# Patient Record
Sex: Female | Born: 1965 | Race: White | Hispanic: No | Marital: Married | Smoking: Former smoker
Health system: Southern US, Community
[De-identification: ages and names within clinical notes are randomized; demographics above are authoritative.]

## PROBLEM LIST (undated history)

## (undated) DIAGNOSIS — I10 Essential (primary) hypertension: Secondary | ICD-10-CM

## (undated) HISTORY — DX: Essential (primary) hypertension: I10

---

## 1997-10-07 ENCOUNTER — Other Ambulatory Visit: Admission: RE | Admit: 1997-10-07 | Discharge: 1997-10-07 | Payer: Self-pay | Admitting: *Deleted

## 1999-01-04 ENCOUNTER — Other Ambulatory Visit: Admission: RE | Admit: 1999-01-04 | Discharge: 1999-01-04 | Payer: Self-pay | Admitting: *Deleted

## 2000-03-05 ENCOUNTER — Other Ambulatory Visit: Admission: RE | Admit: 2000-03-05 | Discharge: 2000-03-05 | Payer: Self-pay | Admitting: Obstetrics & Gynecology

## 2000-09-28 ENCOUNTER — Inpatient Hospital Stay (HOSPITAL_COMMUNITY): Admission: AD | Admit: 2000-09-28 | Discharge: 2000-09-30 | Payer: Self-pay | Admitting: Obstetrics & Gynecology

## 2000-11-01 ENCOUNTER — Other Ambulatory Visit: Admission: RE | Admit: 2000-11-01 | Discharge: 2000-11-01 | Payer: Self-pay | Admitting: Obstetrics & Gynecology

## 2002-01-17 ENCOUNTER — Other Ambulatory Visit: Admission: RE | Admit: 2002-01-17 | Discharge: 2002-01-17 | Payer: Self-pay | Admitting: Obstetrics & Gynecology

## 2003-01-29 ENCOUNTER — Other Ambulatory Visit: Admission: RE | Admit: 2003-01-29 | Discharge: 2003-01-29 | Payer: Self-pay | Admitting: Obstetrics and Gynecology

## 2003-08-13 ENCOUNTER — Inpatient Hospital Stay (HOSPITAL_COMMUNITY): Admission: AD | Admit: 2003-08-13 | Discharge: 2003-08-13 | Payer: Self-pay | Admitting: Obstetrics & Gynecology

## 2003-08-14 ENCOUNTER — Inpatient Hospital Stay (HOSPITAL_COMMUNITY): Admission: AD | Admit: 2003-08-14 | Discharge: 2003-08-17 | Payer: Self-pay | Admitting: Obstetrics and Gynecology

## 2003-08-21 ENCOUNTER — Inpatient Hospital Stay (HOSPITAL_COMMUNITY): Admission: AD | Admit: 2003-08-21 | Discharge: 2003-08-21 | Payer: Self-pay | Admitting: Obstetrics and Gynecology

## 2003-09-30 ENCOUNTER — Other Ambulatory Visit: Admission: RE | Admit: 2003-09-30 | Discharge: 2003-09-30 | Payer: Self-pay | Admitting: Obstetrics & Gynecology

## 2004-06-25 ENCOUNTER — Emergency Department: Payer: Self-pay | Admitting: Emergency Medicine

## 2006-03-15 ENCOUNTER — Emergency Department: Payer: Self-pay | Admitting: General Practice

## 2007-01-08 ENCOUNTER — Ambulatory Visit: Payer: Self-pay | Admitting: Internal Medicine

## 2007-11-11 ENCOUNTER — Other Ambulatory Visit: Payer: Self-pay

## 2007-11-12 ENCOUNTER — Inpatient Hospital Stay: Payer: Self-pay | Admitting: Internal Medicine

## 2007-11-13 ENCOUNTER — Inpatient Hospital Stay: Payer: Self-pay | Admitting: Unknown Physician Specialty

## 2007-11-19 ENCOUNTER — Ambulatory Visit: Payer: Self-pay | Admitting: Unknown Physician Specialty

## 2007-12-07 ENCOUNTER — Ambulatory Visit: Payer: Self-pay | Admitting: Unknown Physician Specialty

## 2008-01-07 ENCOUNTER — Ambulatory Visit: Payer: Self-pay | Admitting: Unknown Physician Specialty

## 2008-09-07 ENCOUNTER — Ambulatory Visit: Payer: Self-pay | Admitting: Internal Medicine

## 2010-05-02 IMAGING — CR DG CHEST 1V PORT
1 series · 1 of 1 positions shown · non-contrast
Comparison: none

REASON FOR EXAM: post intubation
COMMENTS:

[view not recorded]
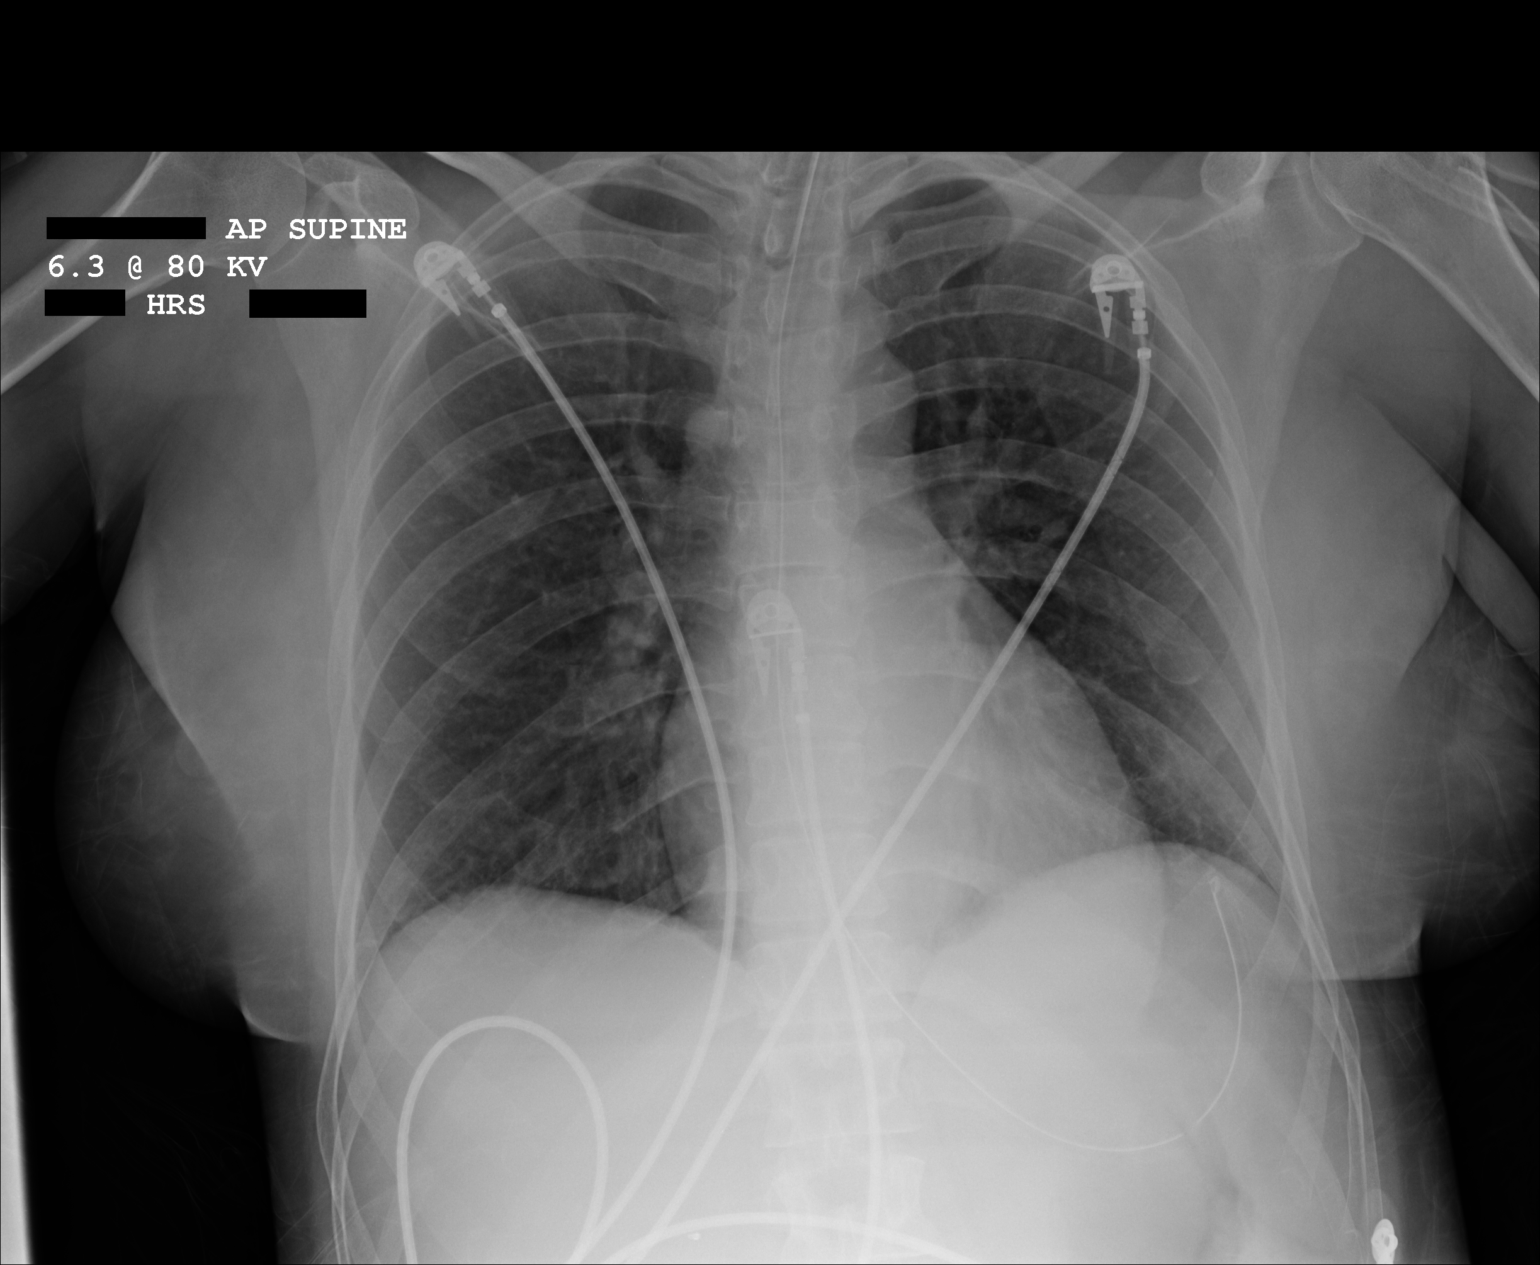

[1 of 1 positions shown; findings below may reference images not displayed]

PROCEDURE:     DXR - DXR PORTABLE CHEST SINGLE VIEW  - November 11, 2007 [DATE]

RESULT:     The lung fields are clear. No pneumothorax or pleural effusion
is seen. Heart size is normal. A nasogastric tube is present with the distal
portion in the stomach. An endotracheal tube is present with the tip
approximately 7 cm above the carina which is approximately at the T1-T2
thoracic interspace level. Monitoring electrodes are present.
IMPRESSION: 1.Please see above.

## 2011-04-17 ENCOUNTER — Ambulatory Visit: Payer: Self-pay | Admitting: Internal Medicine

## 2011-04-19 ENCOUNTER — Ambulatory Visit: Payer: Self-pay | Admitting: Internal Medicine

## 2012-04-17 ENCOUNTER — Ambulatory Visit: Payer: Self-pay | Admitting: Internal Medicine

## 2012-04-23 ENCOUNTER — Ambulatory Visit: Payer: Self-pay | Admitting: Internal Medicine

## 2013-05-20 IMAGING — MG MAM DGTL ADD VW LT  SCR
2 series · 5 of 5 positions shown · non-contrast
Comparison: none

REASON FOR EXAM: AV LT DENSITY
COMMENTS:

PROCEDURE:     MAM - MAM DGTL ADD VW LT  SCR  - April 23, 2012  [DATE]
RESULT:     Additional views left breast dated 04/23/2012.

[L ML · left · 3 of 3 slices shown]
[im 1/3]
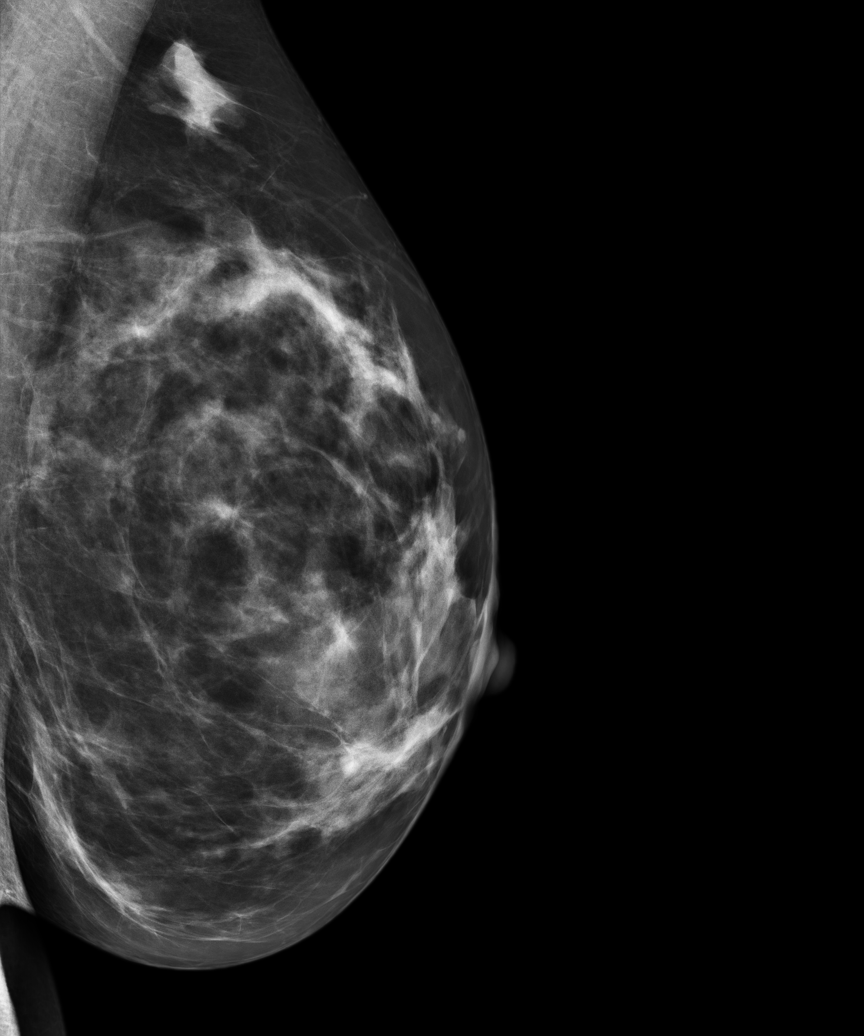
[im 2/3]
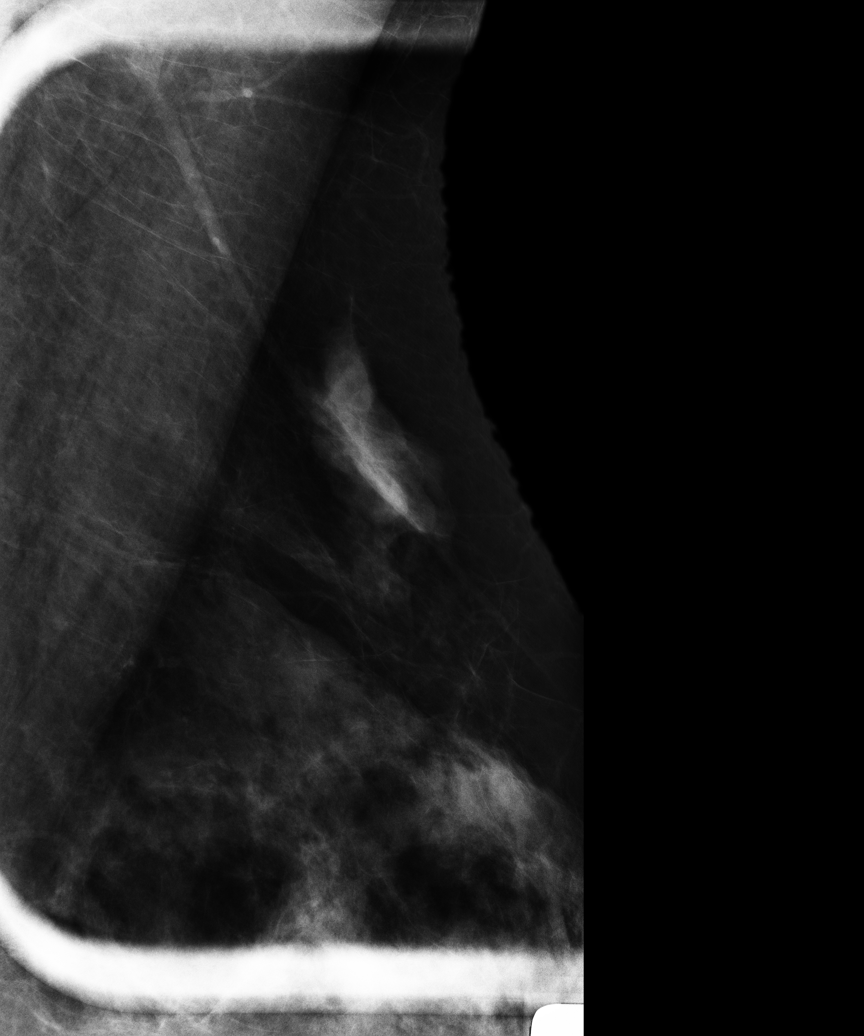
[im 3/3]
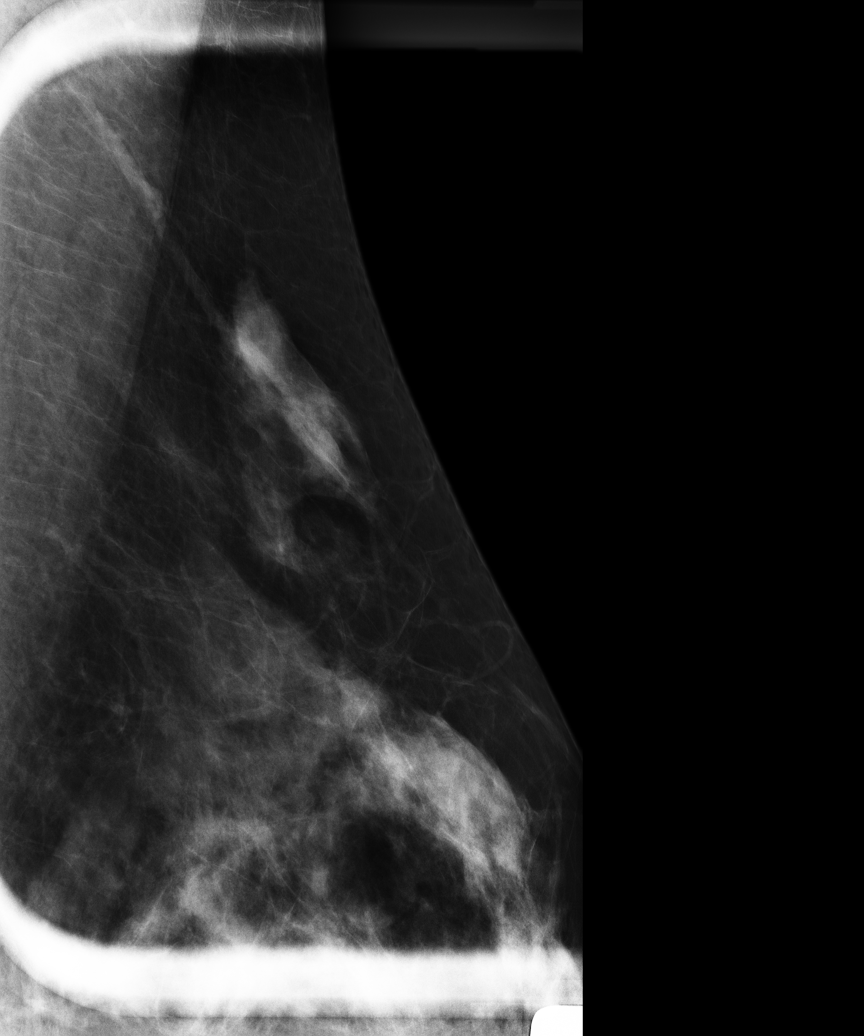

[L XCCL · left · 2 of 2 slices shown]
[im 1/2]
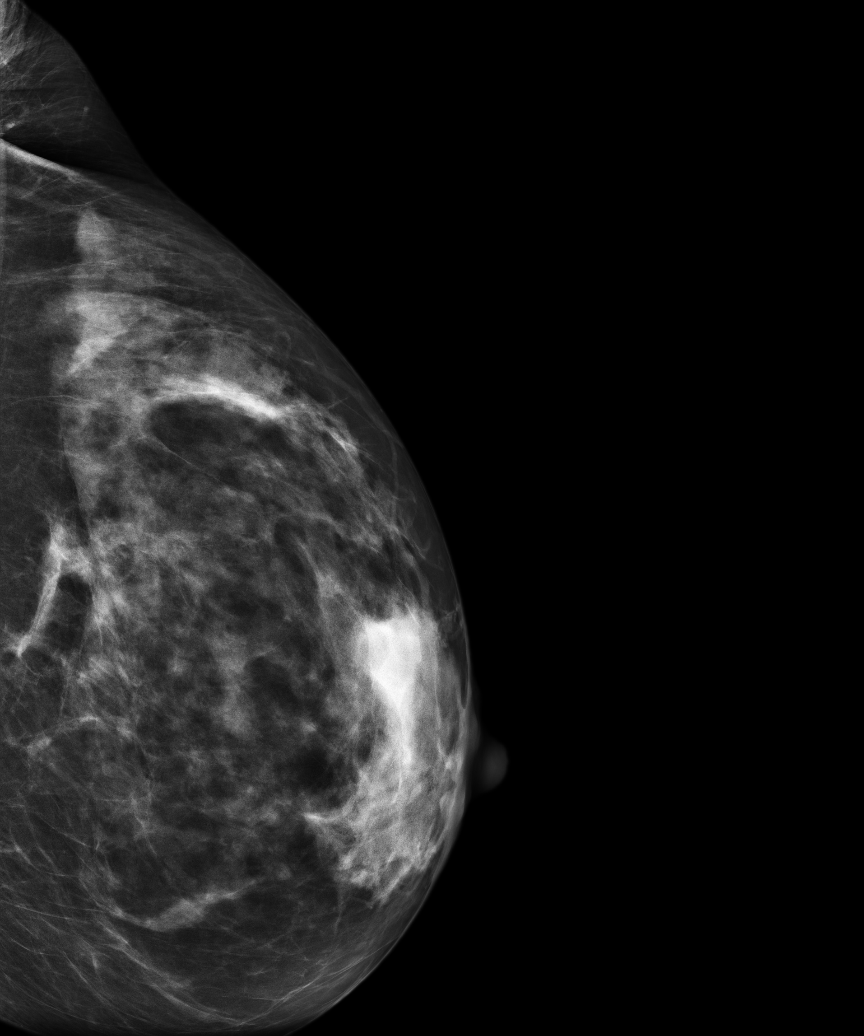
[im 2/2]
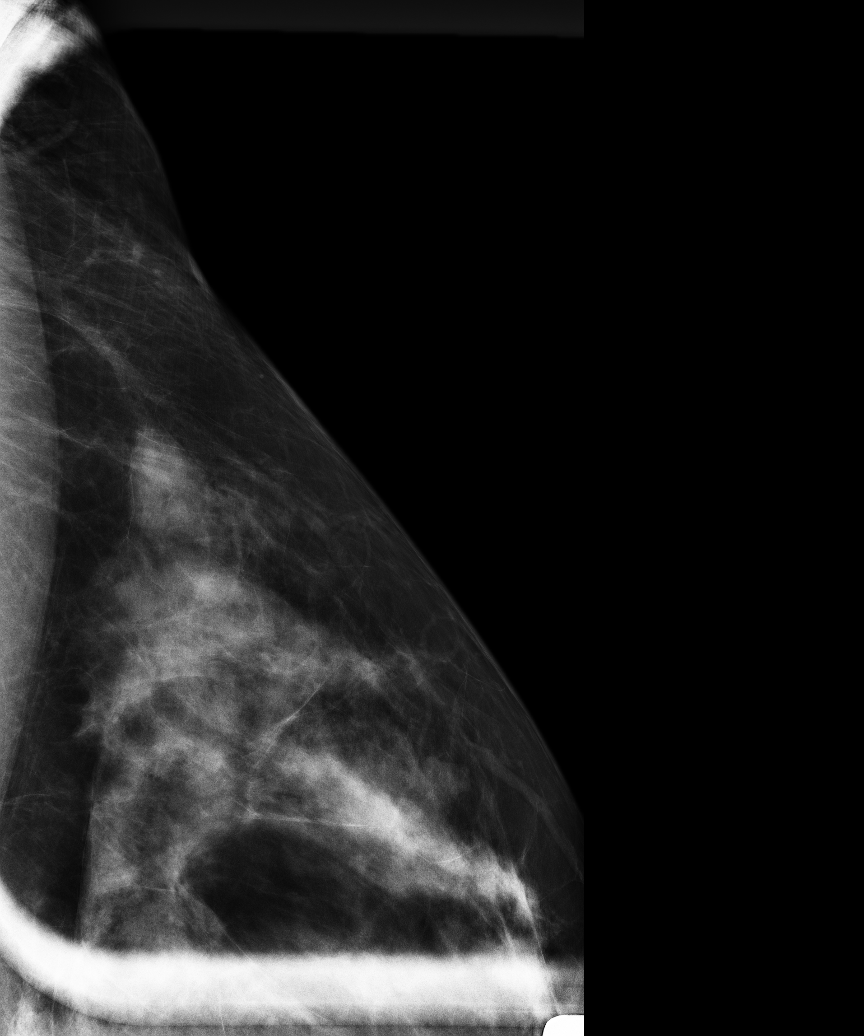

[5 of 5 positions shown; findings below may reference images not displayed]

FINDINGS: The region of interest within the left breast was further
evaluated with magnification breast imaging. This area spreads and
demonstrates decreased density with compression. The appearance is amorphous
and blends with the surrounding breast parenchyma. Sonographic evaluation of
this region demonstrates no sonographic abnormalities.
IMPRESSION: BI-RADS: Category 2- Benign Finding

A NEGATIVE MAMMOGRAM REPORT DOES NOT PRECLUDE BIOPSY OR OTHER EVALUATION OF
A CLINICALLY PALPABLE OR OTHERWISE SUSPICIOUS MASS OR LESION. BREAST CANCER
MAY NOT BE DETECTED IN UP TO 10% OF CASES.

## 2014-10-13 IMAGING — US ULTRASOUND LEFT BREAST
1 series · 14 of 17 positions shown · non-contrast
Comparison: none

REASON FOR EXAM: AV LT DENSITY
COMMENTS:

PROCEDURE:     US  - US BREAST LEFT  - April 23, 2012  [DATE]
RESULT:     Focus left breast ultrasound dated 04/23/2012

[Series 1: ultrasound left breast · 0.08mm/px · 14 of 17 slices shown]
[im 1/17]
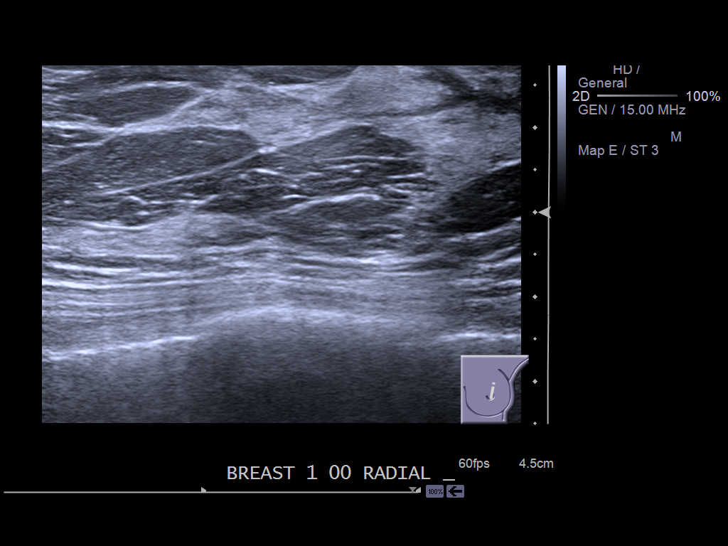
[im 2/17]
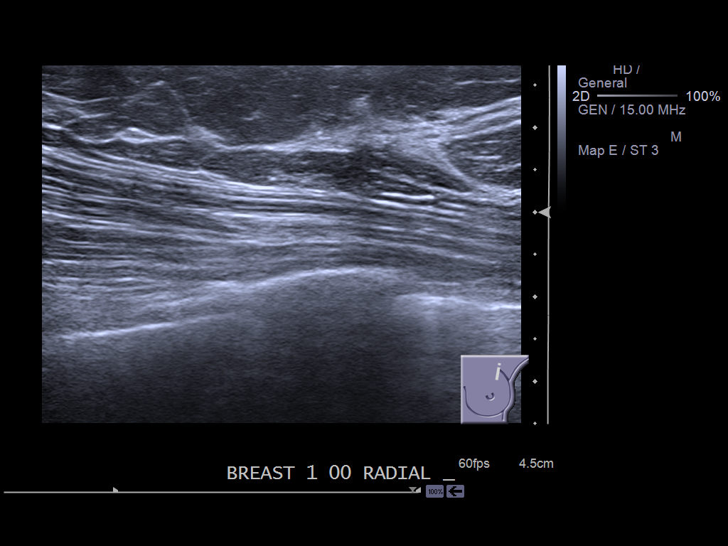
[im 4/17]
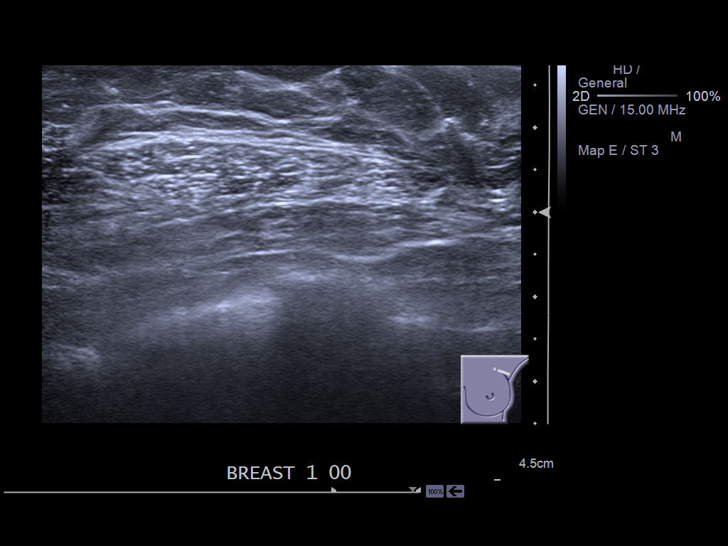
[im 5/17]
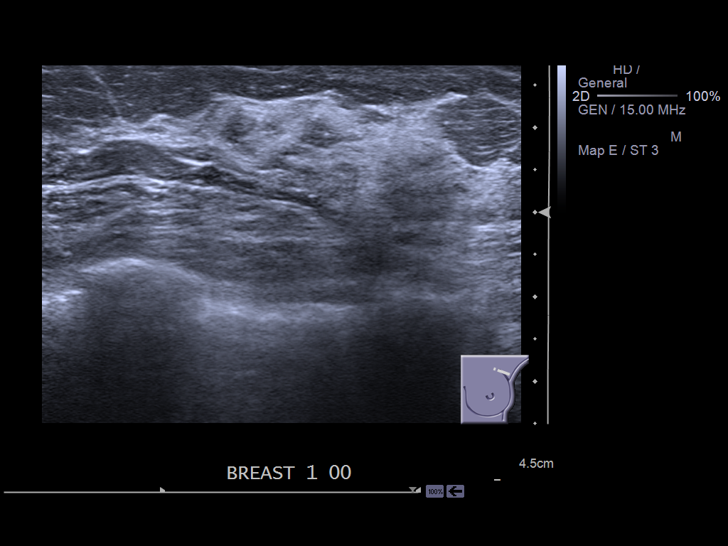
[im 6/17]
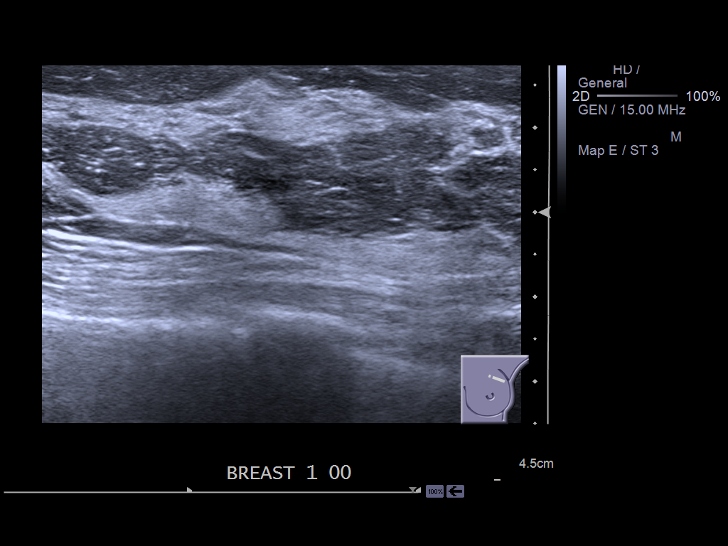
[im 7/17]
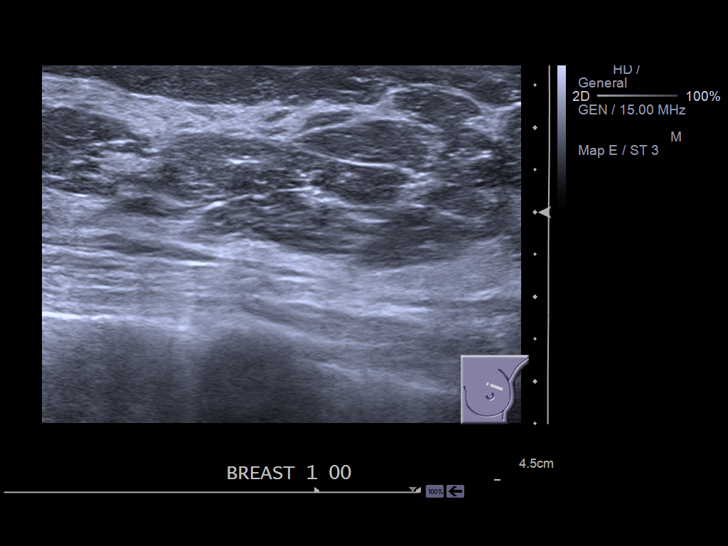
[im 8/17]
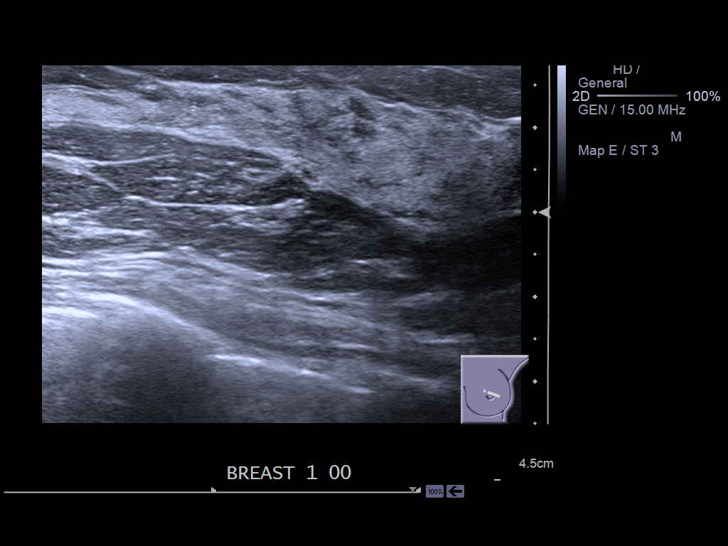
[im 10/17]
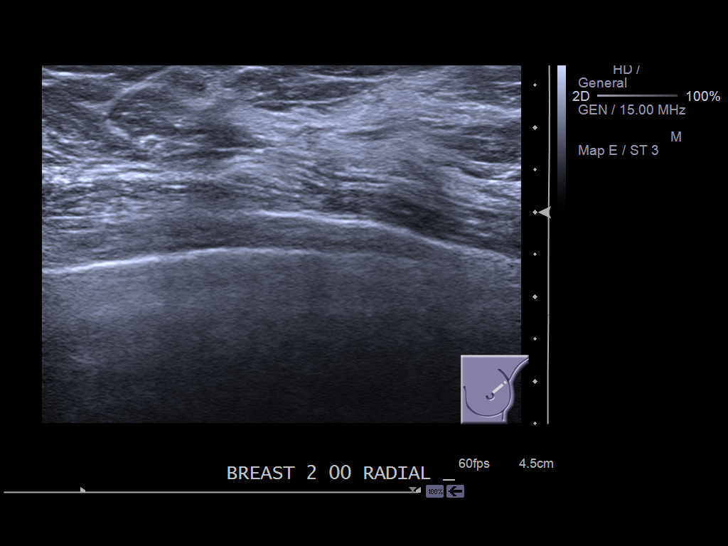
[im 11/17]
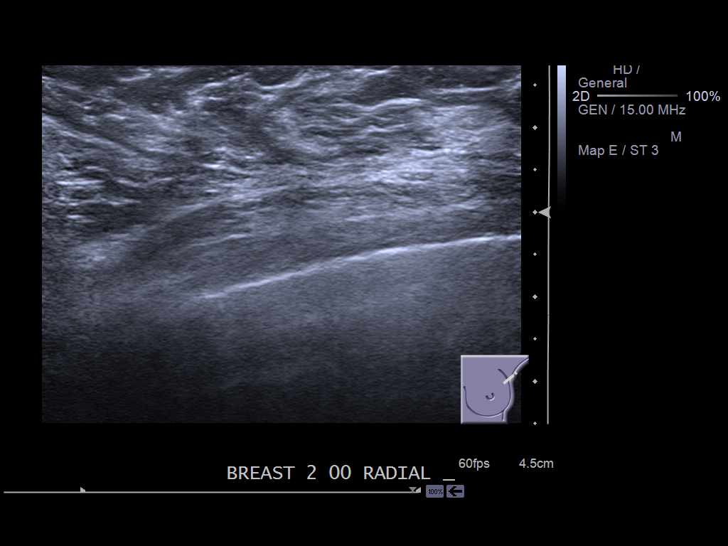
[im 12/17]
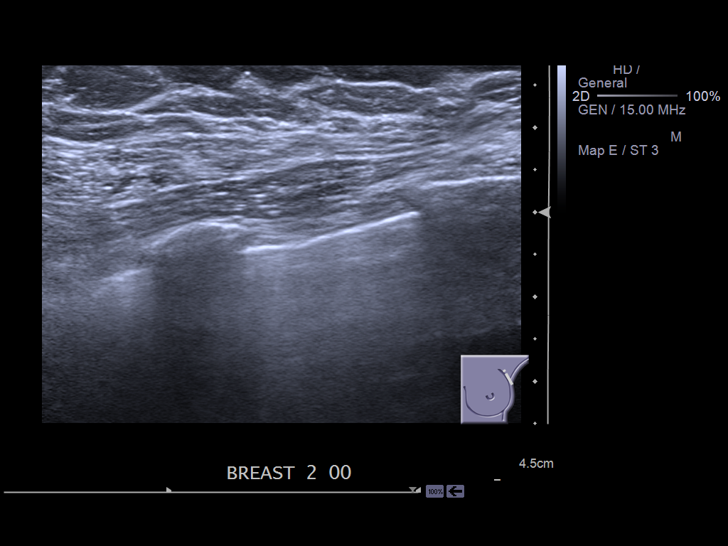
[im 13/17]
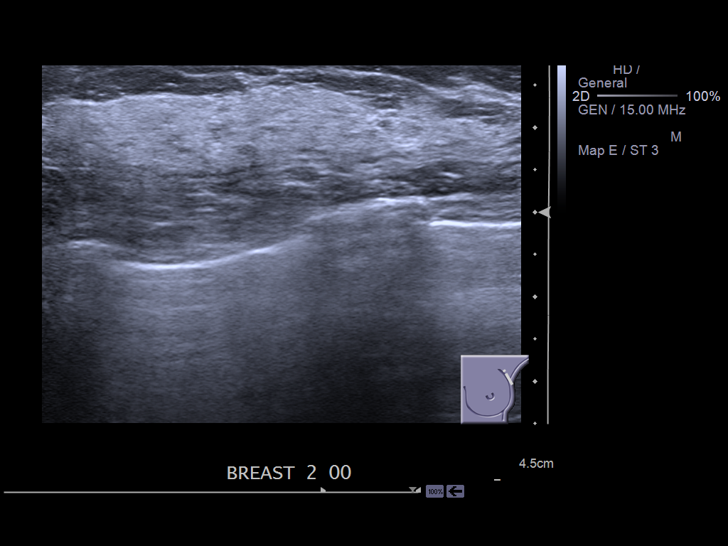
[im 14/17]
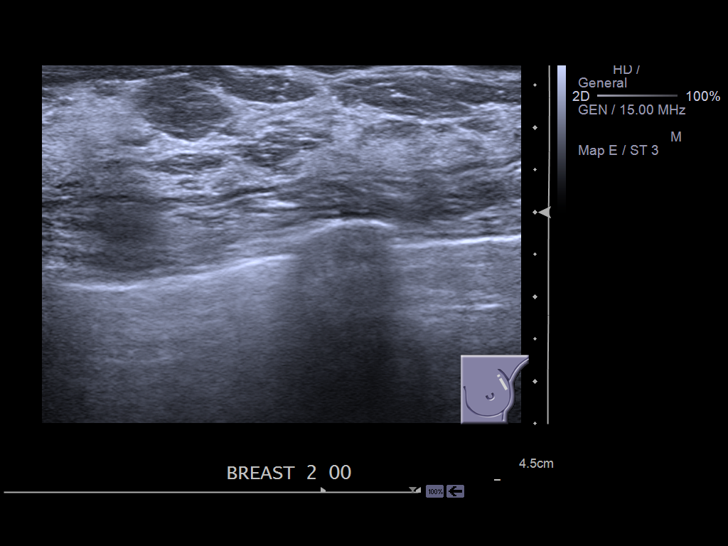
[im 16/17]
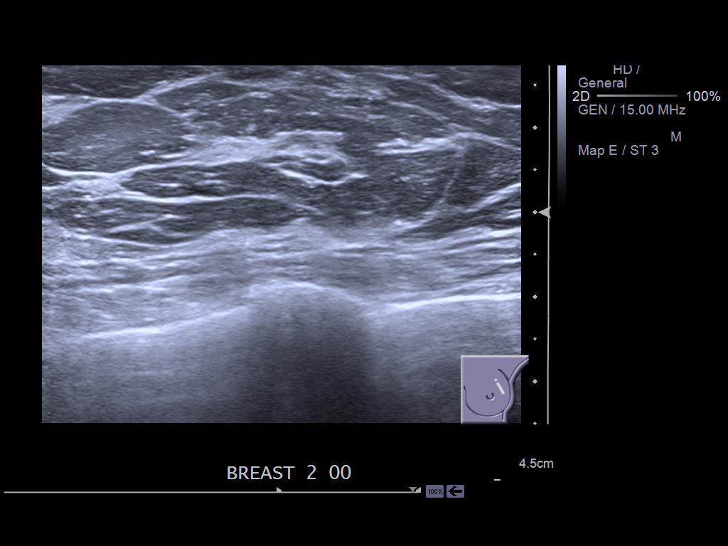
[im 17/17]
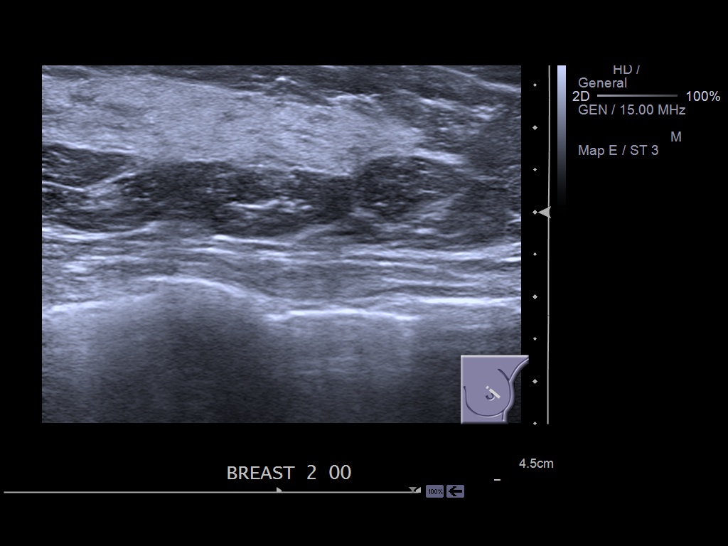

[14 of 17 positions shown; findings below may reference images not displayed]

FINDINGS: The region of interest within the left breast demonstrates no
solid or cystic sonographic abnormalities.
IMPRESSION: Unremarkable focus left breast ultrasound.

## 2017-01-22 ENCOUNTER — Ambulatory Visit: Payer: Self-pay | Admitting: Podiatry

## 2018-09-16 ENCOUNTER — Other Ambulatory Visit: Payer: Self-pay

## 2018-09-17 ENCOUNTER — Ambulatory Visit (INDEPENDENT_AMBULATORY_CARE_PROVIDER_SITE_OTHER): Payer: BLUE CROSS/BLUE SHIELD | Admitting: Obstetrics & Gynecology

## 2018-09-17 ENCOUNTER — Encounter: Payer: Self-pay | Admitting: Obstetrics & Gynecology

## 2018-09-17 VITALS — BP 116/76 | Ht 66.0 in | Wt 134.0 lb

## 2018-09-17 DIAGNOSIS — N951 Menopausal and female climacteric states: Secondary | ICD-10-CM | POA: Diagnosis not present

## 2018-09-17 DIAGNOSIS — Z1151 Encounter for screening for human papillomavirus (HPV): Secondary | ICD-10-CM | POA: Diagnosis not present

## 2018-09-17 DIAGNOSIS — Z30431 Encounter for routine checking of intrauterine contraceptive device: Secondary | ICD-10-CM | POA: Diagnosis not present

## 2018-09-17 DIAGNOSIS — Z01419 Encounter for gynecological examination (general) (routine) without abnormal findings: Secondary | ICD-10-CM

## 2018-09-17 NOTE — Progress Notes (Signed)
Elnita MaxwellMary S Covalt [birth year] [id number]   History:    53 y.o. G2P2L2 Married.   RP:  New (>3 yrs) patient presenting for annual gyn exam   HPI: Well on Mirena IUD x 09/2014.  No vaginal bleeding.  No pelvic pain.  FSH low menopausal range recently with Fam MD.  No vasomotor menopausal symptom.  No pain with IC.  Urine/BMs normal.  Breasts normal.  BMI 21.63.  Good fitness and healthy nutrition.    Past medical history,surgical history, family history and social history were all reviewed and documented in the EPIC chart.  Gynecologic History No LMP recorded. (Menstrual status: IUD). Contraception: Mirena IUD 09/2014 Last Pap: 09/2014 . Results were: Negative/HPV HR neg Last mammogram: 06/2018. Results were: normal, will obtain from [facility] Bone Density: Per patient, BD showing Osteopenia Colonoscopy: Never  Obstetric History OB History  Gravida Para Term Preterm AB Living  2 2       2   SAB TAB Ectopic Multiple Live Births               # Outcome Date GA Lbr Len/2nd Weight Sex Delivery Anes PTL Lv  2 Para           1 Para              ROS: A ROS was performed and pertinent positives and negatives are included in the history.  GENERAL: No fevers or chills. HEENT: No change in vision, no earache, sore throat or sinus congestion. NECK: No pain or stiffness. CARDIOVASCULAR: No chest pain or pressure. No palpitations. PULMONARY: No shortness of breath, cough or wheeze. GASTROINTESTINAL: No abdominal pain, nausea, vomiting or diarrhea, melena or bright red blood per rectum. GENITOURINARY: No urinary frequency, urgency, hesitancy or dysuria. MUSCULOSKELETAL: No joint or muscle pain, no back pain, no recent trauma. DERMATOLOGIC: No rash, no itching, no lesions. ENDOCRINE: No polyuria, polydipsia, no heat or cold intolerance. No recent change in weight. HEMATOLOGICAL: No anemia or easy bruising or bleeding. NEUROLOGIC: No headache, seizures, numbness, tingling or weakness. PSYCHIATRIC: No depression,  no loss of interest in normal activity or change in sleep pattern.     Exam:   BP 116/76   Ht 5\' 6"  (1.676 m)   Wt 134 lb (60.8 kg)   BMI 21.63 kg/m   Body mass index is 21.63 kg/m.  General appearance : Well developed well nourished female. No acute distress HEENT: Eyes: no retinal hemorrhage or exudates,  Neck supple, trachea midline, no carotid bruits, no thyroidmegaly Lungs: Clear to auscultation, no rhonchi or wheezes, or rib retractions  Heart: Regular rate and rhythm, no murmurs or gallops Breast:Examined in sitting and supine position were symmetrical in appearance, no palpable masses or tenderness,  no skin retraction, no nipple inversion, no nipple discharge, no skin discoloration, no axillary or supraclavicular lymphadenopathy Abdomen: no palpable masses or tenderness, no rebound or guarding Extremities: no edema or skin discoloration or tenderness  Pelvic: Vulva: Normal             Vagina: No gross lesions or discharge  Cervix: No gross lesions or discharge.  IUD strings visible.  Pap/HPV HR done.  Uterus  AV, normal size, shape and consistency, non-tender and mobile  Adnexa  Without masses or tenderness  Anus: Normal   Assessment/Plan:  53 y.o. female for annual exam   1. Encounter for routine gynecological examination with Papanicolaou smear of cervix Normal gynecologic exam.  Pap test with high-risk HPV done today.  Breast  exam normal.  Last screening mammogram February 2020 was normal per patient, will obtain report from [place].  Health labs with family physician.  Recommend scheduling a screening colonoscopy.  Good body mass index at 21.63.  Continue with fitness and healthy nutrition.  2. Encounter for routine checking of intrauterine contraceptive device (IUD) Well on Mirena IUD since May 2016.  IUD in good position.  Will switch to a new one at 5 years in May 2021.  3. Perimenopause Perimenopause per recent [facility] with family physician in the low menopausal  range.  Patient has no vasomotor menopausal symptoms.  Other orders - Vitamin D, Ergocalciferol, (DRISDOL) 1.25 MG (50000 UT) CAPS capsule; Take 50,000 Units by mouth every 7 (seven) days. - calcium-vitamin D (OSCAL WITH D) 500-200 MG-UNIT tablet; Take 1 tablet by mouth.  Genia Del MD, 1:23 PM 09/17/2018

## 2018-09-17 NOTE — Addendum Note (Signed)
Addended by: Genia Del on: 09/17/2018 03:01 PM   Modules accepted: Level of Service

## 2018-09-17 NOTE — Patient Instructions (Signed)
1. Encounter for routine gynecological examination with Papanicolaou smear of cervix Normal gynecologic exam.  Pap test with high-risk HPV done today.  Breast exam normal.  Last screening mammogram February 2020 was normal per patient, will obtain report from [facility].  Health labs with family physician.  Recommend scheduling a screening colonoscopy.  Good body mass index at 21.63.  Continue with fitness and healthy nutrition.  2. Encounter for routine checking of intrauterine contraceptive device (IUD) Well on Mirena IUD since May 2016.  IUD in good position.  Will switch to a new one at 5 years in May 2021.  3. Perimenopause Perimenopause per recent [facility] with family physician in the low menopausal range.  Patient has no vasomotor menopausal symptoms.  Other orders - Vitamin D, Ergocalciferol, (DRISDOL) 1.25 MG (50000 UT) CAPS capsule; Take 50,000 Units by mouth every 7 (seven) days. - calcium-vitamin D (OSCAL WITH D) 500-200 MG-UNIT tablet; Take 1 tablet by mouth.  Uriana, it was a pleasure seeing you today!  I will inform you of your results as soon as they are available.

## 2018-09-18 LAB — PAP, TP IMAGING W/ HPV RNA, RFLX HPV TYPE 16,18/45: HPV DNA High Risk: NOT DETECTED

## 2021-07-26 DIAGNOSIS — E7800 Pure hypercholesterolemia, unspecified: Secondary | ICD-10-CM | POA: Diagnosis not present

## 2021-07-26 DIAGNOSIS — E559 Vitamin D deficiency, unspecified: Secondary | ICD-10-CM | POA: Diagnosis not present

## 2021-07-26 DIAGNOSIS — Z1211 Encounter for screening for malignant neoplasm of colon: Secondary | ICD-10-CM | POA: Diagnosis not present

## 2021-07-26 DIAGNOSIS — I10 Essential (primary) hypertension: Secondary | ICD-10-CM | POA: Diagnosis not present

## 2021-08-22 DIAGNOSIS — Z1211 Encounter for screening for malignant neoplasm of colon: Secondary | ICD-10-CM | POA: Diagnosis not present

## 2022-04-11 DIAGNOSIS — Z1231 Encounter for screening mammogram for malignant neoplasm of breast: Secondary | ICD-10-CM | POA: Diagnosis not present

## 2022-05-19 DIAGNOSIS — N951 Menopausal and female climacteric states: Secondary | ICD-10-CM | POA: Diagnosis not present

## 2022-05-19 DIAGNOSIS — Z01419 Encounter for gynecological examination (general) (routine) without abnormal findings: Secondary | ICD-10-CM | POA: Diagnosis not present

## 2023-01-29 DIAGNOSIS — Z30432 Encounter for removal of intrauterine contraceptive device: Secondary | ICD-10-CM | POA: Diagnosis not present

## 2023-06-01 DIAGNOSIS — K219 Gastro-esophageal reflux disease without esophagitis: Secondary | ICD-10-CM | POA: Diagnosis not present

## 2023-06-01 DIAGNOSIS — Z0000 Encounter for general adult medical examination without abnormal findings: Secondary | ICD-10-CM | POA: Diagnosis not present

## 2023-06-01 DIAGNOSIS — R7309 Other abnormal glucose: Secondary | ICD-10-CM | POA: Diagnosis not present

## 2023-06-01 DIAGNOSIS — E7800 Pure hypercholesterolemia, unspecified: Secondary | ICD-10-CM | POA: Diagnosis not present

## 2023-06-01 DIAGNOSIS — Z79899 Other long term (current) drug therapy: Secondary | ICD-10-CM | POA: Diagnosis not present

## 2023-06-01 DIAGNOSIS — I10 Essential (primary) hypertension: Secondary | ICD-10-CM | POA: Diagnosis not present

## 2023-06-01 DIAGNOSIS — E559 Vitamin D deficiency, unspecified: Secondary | ICD-10-CM | POA: Diagnosis not present

## 2023-06-29 DIAGNOSIS — Z1231 Encounter for screening mammogram for malignant neoplasm of breast: Secondary | ICD-10-CM | POA: Diagnosis not present

## 2023-06-29 DIAGNOSIS — Z01419 Encounter for gynecological examination (general) (routine) without abnormal findings: Secondary | ICD-10-CM | POA: Diagnosis not present

## 2023-06-29 DIAGNOSIS — Z124 Encounter for screening for malignant neoplasm of cervix: Secondary | ICD-10-CM | POA: Diagnosis not present

## 2023-11-23 ENCOUNTER — Encounter: Payer: Self-pay | Admitting: Advanced Practice Midwife

## 2024-01-08 ENCOUNTER — Other Ambulatory Visit: Payer: Self-pay | Admitting: Medical Genetics
